# Patient Record
Sex: Female | Born: 2000 | Race: Black or African American | Hispanic: No | Marital: Single
Health system: Southern US, Community
[De-identification: ages and names within clinical notes are randomized; demographics above are authoritative.]

## PROBLEM LIST (undated history)

## (undated) DIAGNOSIS — E669 Obesity, unspecified: Secondary | ICD-10-CM

## (undated) HISTORY — PX: TOE SURGERY: SHX1073

---
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
## 2019-01-22 ENCOUNTER — Emergency Department (HOSPITAL_COMMUNITY): Payer: Medicaid Other

## 2019-01-22 ENCOUNTER — Emergency Department (HOSPITAL_COMMUNITY)
Admission: EM | Admit: 2019-01-22 | Discharge: 2019-01-22 | Disposition: A | Discharge status: Home / Self Care | Payer: Medicaid Other | Attending: Emergency Medicine | Admitting: Emergency Medicine

## 2019-01-22 ENCOUNTER — Other Ambulatory Visit: Payer: Self-pay

## 2019-01-22 ENCOUNTER — Encounter (HOSPITAL_COMMUNITY): Payer: Self-pay

## 2019-01-22 DIAGNOSIS — Z6841 Body Mass Index (BMI) 40.0 and over, adult: Secondary | ICD-10-CM | POA: Insufficient documentation

## 2019-01-22 DIAGNOSIS — B9689 Other specified bacterial agents as the cause of diseases classified elsewhere: Secondary | ICD-10-CM | POA: Insufficient documentation

## 2019-01-22 DIAGNOSIS — N939 Abnormal uterine and vaginal bleeding, unspecified: Secondary | ICD-10-CM | POA: Insufficient documentation

## 2019-01-22 DIAGNOSIS — N760 Acute vaginitis: Secondary | ICD-10-CM | POA: Insufficient documentation

## 2019-01-22 DIAGNOSIS — Z01411 Encounter for gynecological examination (general) (routine) with abnormal findings: Secondary | ICD-10-CM

## 2019-01-22 DIAGNOSIS — E669 Obesity, unspecified: Secondary | ICD-10-CM | POA: Insufficient documentation

## 2019-01-22 DIAGNOSIS — Z7722 Contact with and (suspected) exposure to environmental tobacco smoke (acute) (chronic): Secondary | ICD-10-CM | POA: Insufficient documentation

## 2019-01-22 LAB — URINALYSIS, ROUTINE W REFLEX MICROSCOPIC
Bilirubin Urine: NEGATIVE
Glucose, UA: NEGATIVE mg/dL
Ketones, ur: NEGATIVE mg/dL
Leukocytes,Ua: NEGATIVE
Nitrite: NEGATIVE
Protein, ur: 30 mg/dL — AB
RBC / HPF: >50 RBC/hpf — ABNORMAL HIGH (ref 0–5)
Specific Gravity, Urine: 1.021 (ref 1.005–1.030)
pH: 5 (ref 5.0–8.0)

## 2019-01-22 LAB — COMPREHENSIVE METABOLIC PANEL
ALT: 16 U/L (ref 0–44)
AST: 16 U/L (ref 15–41)
Albumin: 3.4 g/dL — ABNORMAL LOW (ref 3.5–5.0)
Alkaline Phosphatase: 85 U/L (ref 38–126)
Anion gap: 7 (ref 5–15)
BUN: 12 mg/dL (ref 6–20)
CO2: 25 mmol/L (ref 22–32)
Calcium: 9.1 mg/dL (ref 8.9–10.3)
Chloride: 105 mmol/L (ref 98–111)
Creatinine, Ser: 0.88 mg/dL (ref 0.44–1.00)
GFR calc Af Amer: >60 mL/min (ref >60)
GFR calc non Af Amer: >60 mL/min (ref >60)
Glucose, Bld: 97 mg/dL (ref 70–99)
Potassium: 4 mmol/L (ref 3.5–5.1)
Sodium: 137 mmol/L (ref 135–145)
Total Bilirubin: 0.4 mg/dL (ref 0.3–1.2)
Total Protein: 7.6 g/dL (ref 6.5–8.1)

## 2019-01-22 LAB — CBC
HCT: 35.8 % — ABNORMAL LOW (ref 36.0–46.0)
Hemoglobin: 10.4 g/dL — ABNORMAL LOW (ref 12.0–15.0)
MCH: 21.3 pg — ABNORMAL LOW (ref 26.0–34.0)
MCHC: 29.1 g/dL — ABNORMAL LOW (ref 30.0–36.0)
MCV: 73.2 fL — ABNORMAL LOW (ref 80.0–100.0)
Platelets: 368 10*3/uL (ref 150–400)
RBC: 4.89 MIL/uL (ref 3.87–5.11)
RDW: 19.7 % — ABNORMAL HIGH (ref 11.5–15.5)
WBC: 6.9 10*3/uL (ref 4.0–10.5)
nRBC: 0 % (ref 0.0–0.2)

## 2019-01-22 LAB — I-STAT BETA HCG BLOOD, ED (MC, WL, AP ONLY): I-stat hCG, quantitative: <5 m[IU]/mL (ref <5)

## 2019-01-22 LAB — WET PREP, GENITAL
Sperm: NONE SEEN
Trich, Wet Prep: NONE SEEN
Yeast Wet Prep HPF POC: NONE SEEN

## 2019-01-22 MED ORDER — NAPROXEN 500 MG PO TABS: 500.0000 mg | ORAL_TABLET | Freq: Three times a day (TID) | ORAL | 0 refills | Status: AC

## 2019-01-22 MED ORDER — METRONIDAZOLE 500 MG PO TABS: 500.0000 mg | ORAL_TABLET | Freq: Two times a day (BID) | ORAL | 0 refills | Status: AC

## 2019-01-22 MED ORDER — AZITHROMYCIN 250 MG PO TABS
1000.0000 mg | ORAL_TABLET | Freq: Once | ORAL | Status: AC
  Administered 2019-01-22: 1000 mg via ORAL
  Filled 2019-01-22: qty 4

## 2019-01-22 MED ORDER — CEFTRIAXONE SODIUM 250 MG IJ SOLR
250.0000 mg | Freq: Once | INTRAMUSCULAR | Status: AC
  Administered 2019-01-22: 18:00:00 250 mg via INTRAMUSCULAR
  Filled 2019-01-22: qty 250

## 2019-01-22 MED ORDER — DOXYCYCLINE HYCLATE 100 MG PO CAPS: 100.0000 mg | ORAL_CAPSULE | Freq: Two times a day (BID) | ORAL | 0 refills | Status: AC

## 2019-01-22 MED ORDER — STERILE WATER FOR INJECTION IJ SOLN
INTRAMUSCULAR | Status: AC
  Administered 2019-01-22: 0.9 mL
  Filled 2019-01-22: qty 10

## 2019-01-22 NOTE — ED Triage Notes (Signed)
Pt reports vaginal bleeding for the past month, intermittent cramps. States some days are heavier than others. Pt denies any other complaints.

## 2019-01-22 NOTE — ED Provider Notes (Signed)
[facility] EMERGENCY DEPARTMENT Provider Note   CSN: [id number] Arrival date & time: 01/22/19  1321     History   Chief Complaint Chief Complaint  Patient presents with   Vaginal Bleeding    HPI Kelsey Sawyer is a 18 y.o. female presents to the ER for evaluation of vaginal bleeding onset 29 days ago. Reports daily dark blood spotting, she wakes up in the morning and has to change her pad only once in the morning. Has developed intermittent cramping in the lower abdomen and low back that is usually worse at nighttime currently it is only mild. Prior to 1 month ago before this started she used to get regular monthly periods that were sometimes heavy. She has never been sexually active. No history of STDs, once had yeast infection. No IUDs. Does not use condoms. Does not take any medicines or birth control. No known pelvic issues like endometriosis, cysts or fibroids. No bleeding or clotting disorders. No interventions. Modifying factors. Denies associated fever, nausea, vomiting, dysuria, chest pain, shortness of breath or lightheadedness.   HPI  Past Medical History:  Diagnosis Date   Obesity     There are no active problems to display for this patient.   Past Surgical History:  Procedure Laterality Date   MASS EXCISION  08/29/2011   posterior scalp   SCALP LACERATION REPAIR N/A 06/17/2013   Procedure: EXCISION OF KELOID OF SCALP ;  Surgeon: Glenna Fellows, MD;  Location: [facility] OR;  Service: Plastics;  Laterality: N/A;   TOE SURGERY     stitches     OB History   No obstetric history on file.      Home Medications    Prior to Admission medications   Medication Sig Start Date End Date Taking? Authorizing Provider  cyclobenzaprine (FLEXERIL) 5 MG tablet Take 1 tablet (5 mg total) by mouth 3 (three) times daily as needed for muscle spasms. 06/01/15   Lowanda Foster, NP  doxycycline (VIBRAMYCIN) 100 MG capsule Take 1 capsule (100 mg total) by mouth  2 (two) times daily. 01/22/19   Liberty Handy, PA-C  HYDROcodone-acetaminophen (NORCO) 5-325 MG per tablet Take 1 tablet by mouth every 6 (six) hours as needed for moderate pain. 06/17/13   Glenna Fellows, MD  ibuprofen (ADVIL,MOTRIN) 600 MG tablet Take 1 tab PO Q6h x 2 days then Q6h prn pain 06/01/15   Lowanda Foster, NP  metroNIDAZOLE (FLAGYL) 500 MG tablet Take 1 tablet (500 mg total) by mouth 2 (two) times daily. 01/22/19   Liberty Handy, PA-C  naproxen (NAPROSYN) 500 MG tablet Take 1 tablet (500 mg total) by mouth 3 (three) times daily with meals for 7 days. 01/22/19 01/29/19  Liberty Handy, PA-C    Family History Family History  Problem Relation Age of Onset   Miscarriages / [place] Mother    Arthritis Father    Depression Father    Diabetes Father    Hypertension Father    Hyperlipidemia Father    Mental retardation Father    Learning disabilities Father    Asthma Brother    Drug abuse Maternal Aunt    Asthma Paternal Aunt    Kidney disease Paternal Aunt    Early death Paternal Uncle    Heart disease Paternal Uncle    Arthritis Maternal Grandfather    Vision loss Maternal Grandfather     Social History Social History   Tobacco Use   Smoking status: Passive Smoke Exposure - Never Smoker  Substance Use Topics   Alcohol use: Not on file   Drug use: Not on file     Allergies   Patient has no known allergies.   Review of Systems Review of Systems  Genitourinary: Positive for menstrual problem, pelvic pain and vaginal bleeding.  Musculoskeletal: Positive for back pain.  All other systems reviewed and are negative.    Physical Exam Updated Vital Signs BP 113/79 (BP Location: Right Wrist)    Pulse 80    Temp 98 F (36.7 C) (Oral)    Resp 15    Ht 5\' 4"  (1.626 m)    Wt (!) 181.4 kg    LMP 12/23/2018    SpO2 100%    BMI 68.66 kg/m   Physical Exam Vitals signs and nursing note reviewed.  Constitutional:      Appearance: She  is well-developed.     Comments: Very obese female.  HENT:     Head: Normocephalic and atraumatic.     Nose: Nose normal.  Eyes:     Conjunctiva/sclera: Conjunctivae normal.  Neck:     Musculoskeletal: Normal range of motion.  Cardiovascular:     Rate and Rhythm: Normal rate and regular rhythm.  Pulmonary:     Effort: Pulmonary effort is normal.     Breath sounds: Normal breath sounds.  Abdominal:     General: Bowel sounds are normal.     Palpations: Abdomen is soft.     Tenderness: There is no abdominal tenderness.     Comments: Obese abdomen, soft, nontender. No lower/suprapubic or CVA tenderness.  Negative murphy's and mcburney's  Genitourinary:    Comments:  Exam performed with EMT at bedside for assistance. External genitalia without lesions.  No groin lymphadenopathy.  Vaginal mucosa and cervix pink without lesions.  Scant dark blood in vaginal vault.  Cervical os visualized, closed without polyps, friability, lesions.  Patient has significant pain with entire pelvic exam including external genitalia examination, insertion of speculum and bimanual exam, frequently jumping up and complaining of pain.  Asked her if it was pinching of the skin or hair or deeper examination and states "everything" hurts even palpation of external genitalia/intraoitus.  +CMT.  Nonpalpable, but diffuse tenderness in both adnexa.  Perianal skin normal without lesions. Musculoskeletal: Normal range of motion.     Comments: No reproducible TL spine midline or paraspinal muscle tenderness. Able to sit up and reposition herself in bed without difficulty or assistance.  Skin:    General: Skin is warm and dry.     Capillary Refill: Capillary refill takes less than 2 seconds.  Neurological:     Mental Status: She is alert.  Psychiatric:        Behavior: Behavior normal.      ED Treatments / Results  Labs (all labs ordered are listed, but only abnormal results are displayed) Labs Reviewed  WET PREP,  GENITAL - Abnormal; Notable for the following components:      Result Value   Clue Cells Wet Prep HPF POC PRESENT (*)    WBC, Wet Prep HPF POC FEW (*)    All other components within normal limits  CBC - Abnormal; Notable for the following components:   Hemoglobin 10.4 (*)    HCT 35.8 (*)    MCV 73.2 (*)    MCH 21.3 (*)    MCHC 29.1 (*)    RDW 19.7 (*)    All other components within normal limits  COMPREHENSIVE METABOLIC PANEL - Abnormal; Notable for  the following components:   Albumin 3.4 (*)    All other components within normal limits  URINALYSIS, ROUTINE W REFLEX MICROSCOPIC - Abnormal; Notable for the following components:   Hgb urine dipstick LARGE (*)    Protein, ur 30 (*)    RBC / HPF >50 (*)    Bacteria, UA RARE (*)    All other components within normal limits  URINE CULTURE  I-STAT BETA HCG BLOOD, ED ([facility], [facility], [facility] ONLY)  GC/CHLAMYDIA PROBE AMP ([place]) NOT AT [facility]    EKG None  Radiology US Transvaginal Non-ob  Result Date: 01/22/2019 CLINICAL DATA:  Vaginal bleeding and pelvic pain. EXAM: TRANSABDOMINAL AND TRANSVAGINAL ULTRASOUND OF PELVIS DOPPLER ULTRASOUND OF OVARIES TECHNIQUE: Both transabdominal and transvaginal ultrasound examinations of the pelvis were performed. Transabdominal technique was performed for global imaging of the pelvis including uterus, ovaries, adnexal regions, and pelvic cul-de-sac. It was necessary to proceed with endovaginal exam following the transabdominal exam to visualize the ovaries. Color and duplex Doppler ultrasound was utilized to evaluate blood flow to the ovaries. COMPARISON:  None. FINDINGS: Uterus Measurements: 7.1 x 3.8 x 3.6 cm. = volume: 50 mL. The fundus was difficult to visualize. No definite uterine mass. Endometrium Thickness: 8 mm.  No focal abnormality visualized. Right ovary Measurements: 2.6 x 3 x 1.96 cm = volume: 7.8 mL. Normal appearance/no adnexal mass. Left ovary Not visualized on this exam. Pulsed Doppler evaluation  of both ovaries demonstrates a normal arterial and venous waveform involving the right ovary. The left ovary was not visualized. Other findings No abnormal free fluid. IMPRESSION: 1. Limited study as detailed above. 2. The left ovary was not visualized. 3. Unremarkable appearance of the right ovary. 4. No acute sonographic abnormality. Electronically Signed   By: Katherine Mantle M.D.   On: 01/22/2019 21:30   US Pelvis Complete  Result Date: 01/22/2019 CLINICAL DATA:  Vaginal bleeding and pelvic pain. EXAM: TRANSABDOMINAL AND TRANSVAGINAL ULTRASOUND OF PELVIS DOPPLER ULTRASOUND OF OVARIES TECHNIQUE: Both transabdominal and transvaginal ultrasound examinations of the pelvis were performed. Transabdominal technique was performed for global imaging of the pelvis including uterus, ovaries, adnexal regions, and pelvic cul-de-sac. It was necessary to proceed with endovaginal exam following the transabdominal exam to visualize the ovaries. Color and duplex Doppler ultrasound was utilized to evaluate blood flow to the ovaries. COMPARISON:  None. FINDINGS: Uterus Measurements: 7.1 x 3.8 x 3.6 cm. = volume: 50 mL. The fundus was difficult to visualize. No definite uterine mass. Endometrium Thickness: 8 mm.  No focal abnormality visualized. Right ovary Measurements: 2.6 x 3 x 1.96 cm = volume: 7.8 mL. Normal appearance/no adnexal mass. Left ovary Not visualized on this exam. Pulsed Doppler evaluation of both ovaries demonstrates a normal arterial and venous waveform involving the right ovary. The left ovary was not visualized. Other findings No abnormal free fluid. IMPRESSION: 1. Limited study as detailed above. 2. The left ovary was not visualized. 3. Unremarkable appearance of the right ovary. 4. No acute sonographic abnormality. Electronically Signed   By: Katherine Mantle M.D.   On: 01/22/2019 21:30   [place] Art/ven Flow Abd Pelv Doppler  Result Date: 01/22/2019 CLINICAL DATA:  Vaginal bleeding and pelvic pain.  EXAM: TRANSABDOMINAL AND TRANSVAGINAL ULTRASOUND OF PELVIS DOPPLER ULTRASOUND OF OVARIES TECHNIQUE: Both transabdominal and transvaginal ultrasound examinations of the pelvis were performed. Transabdominal technique was performed for global imaging of the pelvis including uterus, ovaries, adnexal regions, and pelvic cul-de-sac. It was necessary to proceed with endovaginal exam following the  transabdominal exam to visualize the ovaries. Color and duplex Doppler ultrasound was utilized to evaluate blood flow to the ovaries. COMPARISON:  None. FINDINGS: Uterus Measurements: 7.1 x 3.8 x 3.6 cm. = volume: 50 mL. The fundus was difficult to visualize. No definite uterine mass. Endometrium Thickness: 8 mm.  No focal abnormality visualized. Right ovary Measurements: 2.6 x 3 x 1.96 cm = volume: 7.8 mL. Normal appearance/no adnexal mass. Left ovary Not visualized on this exam. Pulsed Doppler evaluation of both ovaries demonstrates a normal arterial and venous waveform involving the right ovary. The left ovary was not visualized. Other findings No abnormal free fluid. IMPRESSION: 1. Limited study as detailed above. 2. The left ovary was not visualized. 3. Unremarkable appearance of the right ovary. 4. No acute sonographic abnormality. Electronically Signed   By: Katherine Mantlehristopher  Green M.D.   On: 01/22/2019 21:30    Procedures Procedures (including critical care time)  Medications Ordered in ED Medications  cefTRIAXone (ROCEPHIN) injection 250 mg (250 mg Intramuscular Given 01/22/19 1748)  azithromycin (ZITHROMAX) tablet 1,000 mg (1,000 mg Oral Given 01/22/19 1748)  sterile water (preservative free) injection (0.9 mLs  Given 01/22/19 1756)     Initial Impression / Assessment and Plan / ED Course  I have reviewed the triage vital signs and the nursing notes.  Pertinent labs & imaging results that were available during my care of the patient were reviewed by me and considered in my medical decision making (see chart  for details).  Clinical Course as of Jan 22 2143  Tue Jan 22, 2019  1727 Clue Cells Wet Prep HPF POC(!): PRESENT [CG]  1727 WBC, Wet Prep HPF POC(!): FEW [CG]  1727 Hemoglobin(!): 10.4 [CG]  1929 In setting of vaginal bleeding/spotting   RBC / HPF(!): >50 [CG]    Clinical Course User Index [CG] Liberty HandyGibbons, Kesia Dalto J, PA-C   18 year old female here with vaginal spotting for 1 month.  Per her description it is light.  Has some low abdominal and low back cramping.  Never been sexually active.  History of yeast infectionbut no other pelvic infections like STDs, BV.  No recent illnesses.  ER work-up initiated in triage shows hemoglobin 10.4.  Normal LFTs.  Hemodynamically stable.  She has no symptoms of anemia.  UA without signs of infection.  Wet prep shows BV and few WBC.  Pelvic exam shows diffuse tenderness in adnexa, and CMT with some discharge.  Given diffuse tenderness and bleeding will obtain pelvic US that could show signs suggestive of PID, uterine fibroids, cysts, etc.    2145: US unremarkable.   Although patient has never been sexually active and never had an STD, her diffuse tenderness on pelvic exam is concerning for PID.  This could be from current BV or other enteric infection.  Given risk of untreated PID, discussed with patient we will treat her for PID.  She understands BV and other enteric infection are rare but possible causes of PID but possible.  Will empirically also give her Rocephin and azithromycin here.  Will discharge with doxycycline, naproxen.  Considered PID complication like TOA but this is unlikely given chronicity of pain and symptoms, normal white count, afebrile.  No adnexal fullness on exam.  Doubt appendicitis given initial presentation of vaginal bleeding, no RLQ tenderness. I do not think further emergent imaging is indicated.  Recommended follow-up with OB/GYN for further discussion of her irregular spotting and lower abdominal pain.  She is not having heavy  bleeding, Hd instability, Strict return  precautions given.  Patient is comfortable with this.  Final Clinical Impressions(s) / ED Diagnoses   Final diagnoses:  Vaginal bleeding  Abnormal pelvic exam  Bacterial vaginosis    ED Discharge Orders         Ordered    doxycycline (VIBRAMYCIN) 100 MG capsule  2 times daily     01/22/19 1927    metroNIDAZOLE (FLAGYL) 500 MG tablet  2 times daily     01/22/19 1927    naproxen (NAPROSYN) 500 MG tablet  3 times daily with meals     01/22/19 1927           Jerrell Mylar 01/22/19 2144    Maia Plan, MD 01/22/19 2230

## 2019-01-22 NOTE — Discharge Instructions (Addendum)
You were seen in the ER for irregular vaginal bleeding and low abdominal and back pain.     We discussed several possibilities that could be causing your bleeding and pain symptoms.  You could have a hormonal disbalance causing to have irregular bleeding, you need to discuss your symptoms with OB/GYN for further evaluation and management.    Given the amount of pain you had during exam, we obtained an ultrasound which was normal.  Swabs obtained today showed bacterial vaginosis.  This is treated with antibiotics called Flagyl, take this as prescribed.  Do not drink alcohol with this medicine or it will cause severe nausea, vomiting or abdominal pain.    Some pelvic infections like gonorrhea, chlamydia, bacterial vaginosis, others can cause pelvic inflammatory disease.  Since you had a lot of discomfort on exam we will treat you for pelvic inflammatory disease.  Take doxycycline as prescribed.  Use naproxen 500 mg every 8 hours for pain.  You can add Tylenol if you need more pain control.    Return to the ER for fever, worsening abdominal pain, abnormal vaginal discharge or heavier vaginal bleeding, chest pain or shortness of breath or lightheadedness.

## 2019-01-22 NOTE — ED Notes (Signed)
Pt voided, but stated that she was unable to collect the specimen. Told to let me know when she has to go again so that we can make sure to collect.

## 2019-01-24 LAB — URINE CULTURE

## 2019-01-25 LAB — GC/CHLAMYDIA PROBE AMP (~~LOC~~) NOT AT ARMC
Chlamydia: NEGATIVE
Neisseria Gonorrhea: NEGATIVE

## 2020-03-30 IMAGING — US US PELVIS COMPLETE
1 series · 13 of 25 positions shown · non-contrast
Comparison: None.

CLINICAL DATA: Vaginal bleeding and pelvic pain.

EXAM:
TRANSABDOMINAL AND TRANSVAGINAL ULTRASOUND OF PELVIS
DOPPLER ULTRASOUND OF OVARIES
TECHNIQUE: Both transabdominal and transvaginal ultrasound examinations of the
pelvis were performed. Transabdominal technique was performed for
global imaging of the pelvis including uterus, ovaries, adnexal
regions, and pelvic cul-de-sac.
It was necessary to proceed with endovaginal exam following the
transabdominal exam to visualize the ovaries. Color and duplex
Doppler ultrasound was utilized to evaluate blood flow to the
ovaries.

[Series 1: us pelvis complete · 34 acquisitions, 13 frames shown]
[im 1/34]
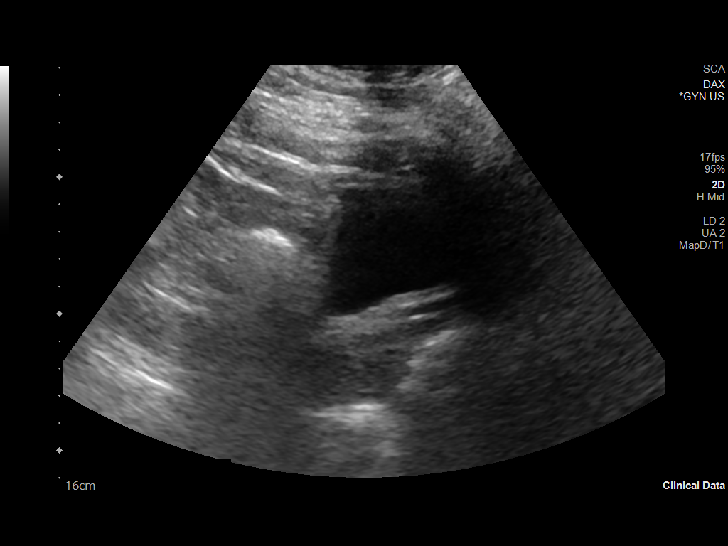
[im 3/34]
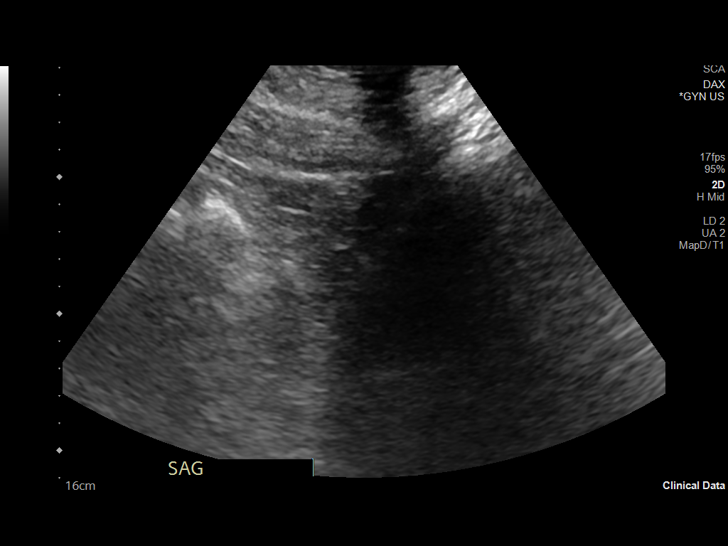
[im 6/34]
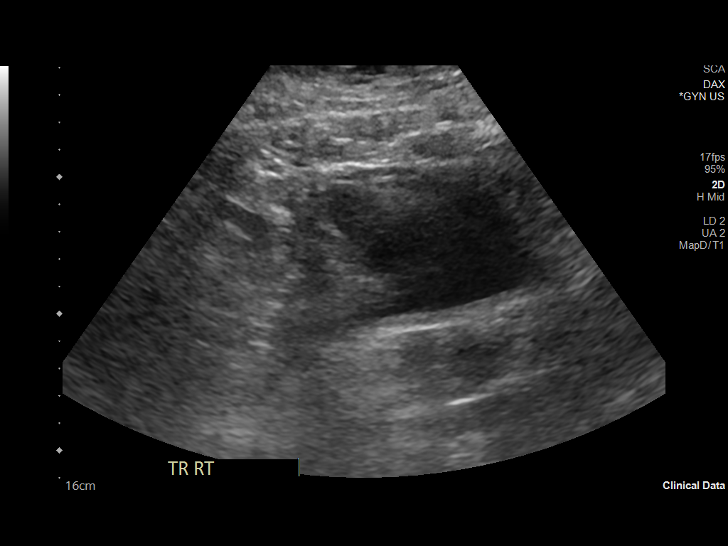
[im 9/34]
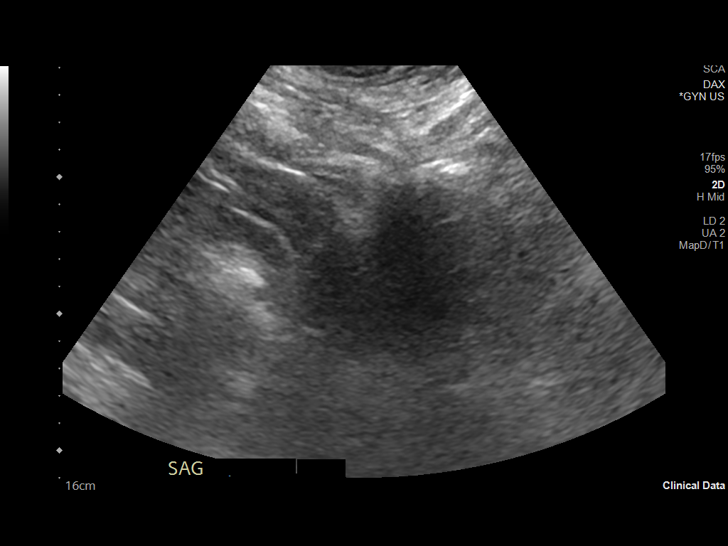
[im 12/34]
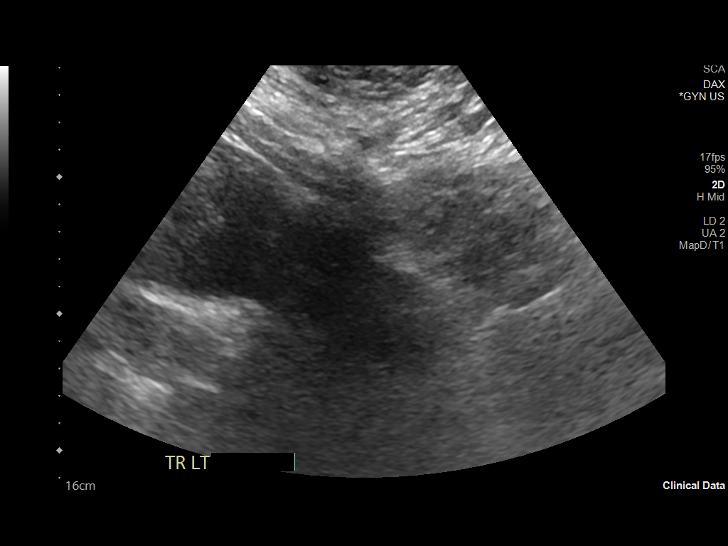
[im 14/34]
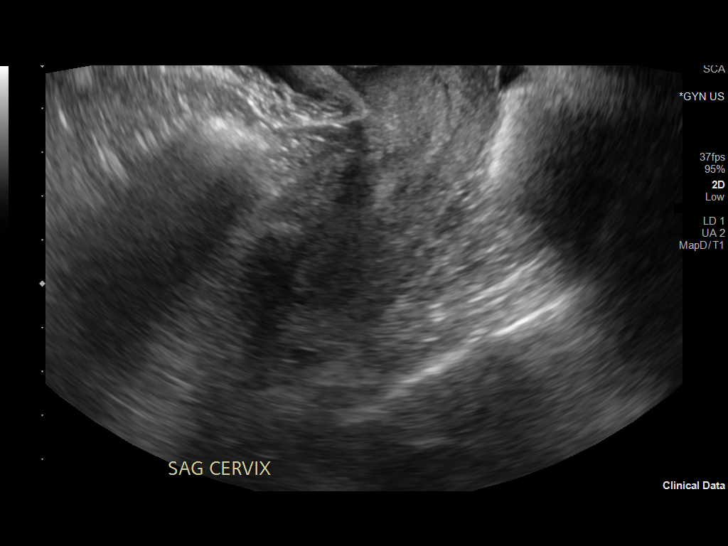
[im 17/34]
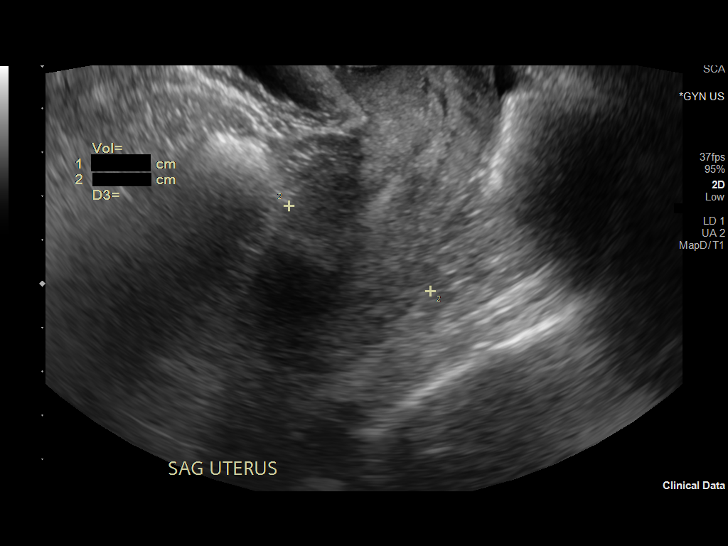
[im 20/34]
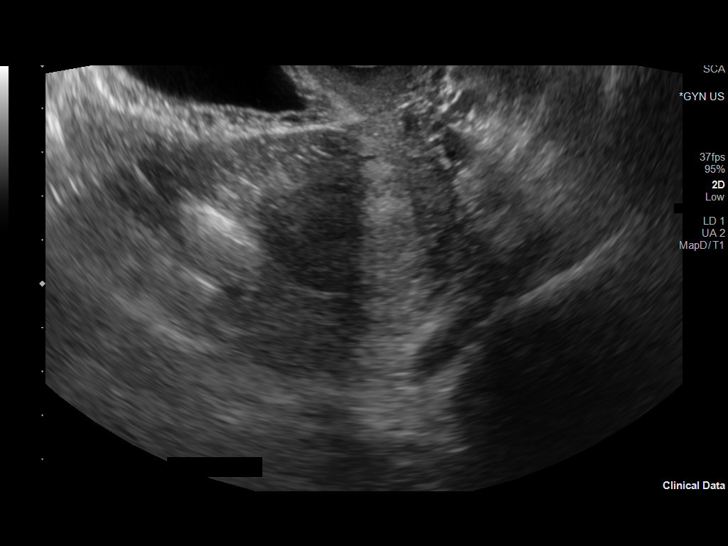
[im 23/34]
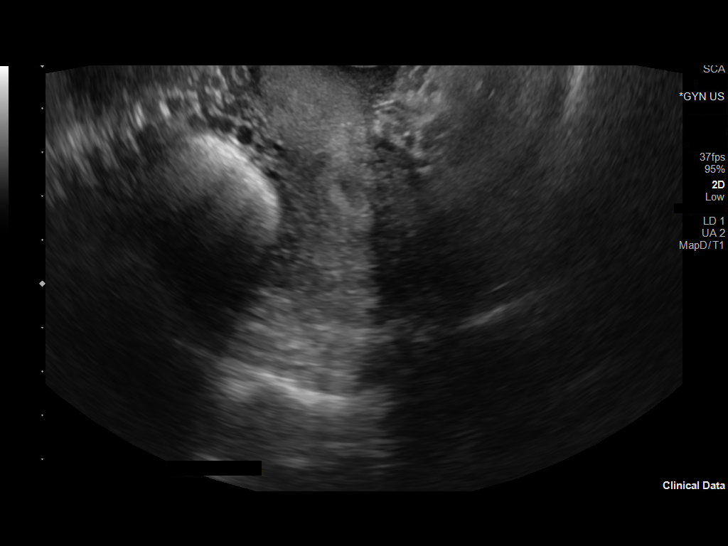
[im 25/34]
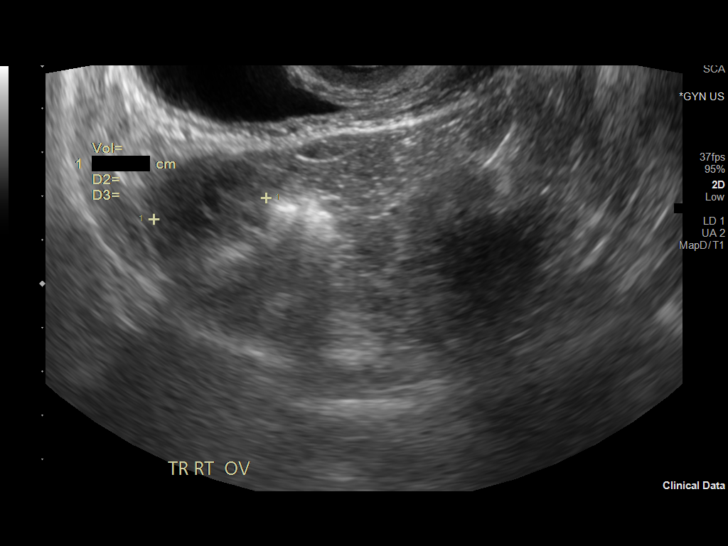
[im 28/34]
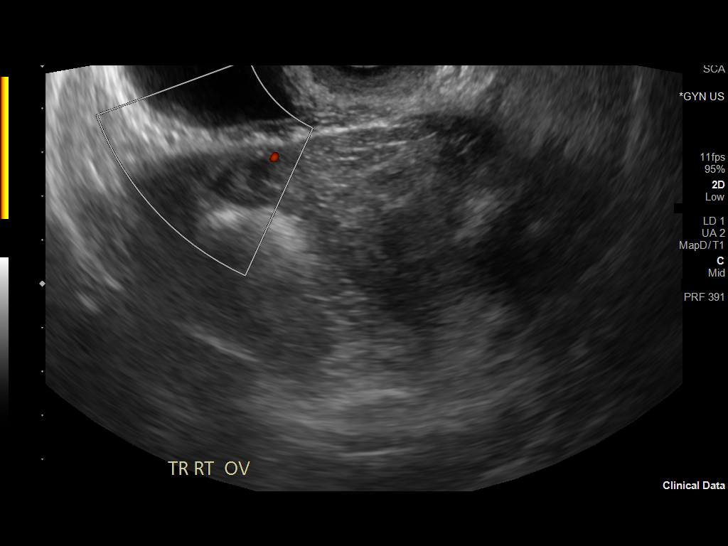
[im 31/34]
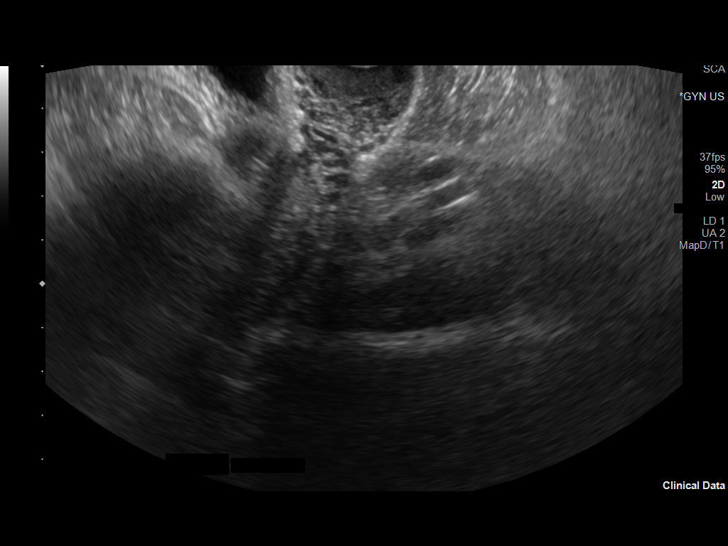
[im 34/34]
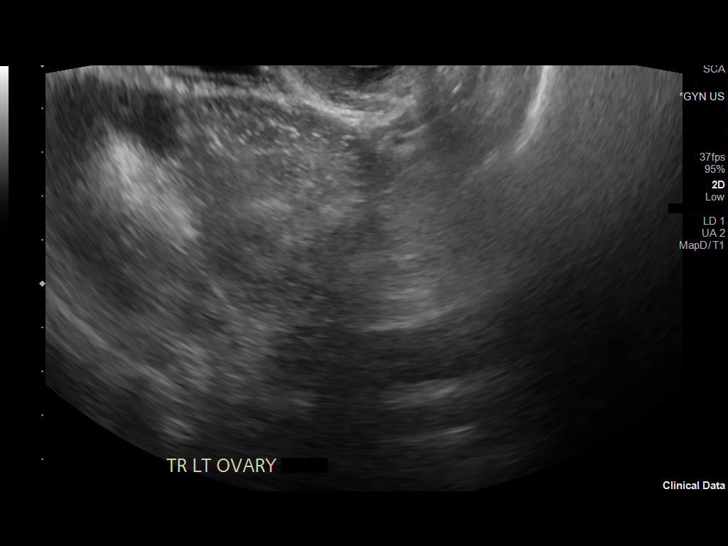

[13 of 25 positions shown; findings below may reference images not displayed]

FINDINGS: Uterus

Measurements: 7.1 x 3.8 x 3.6 cm. = volume: 50 mL. The fundus was
difficult to visualize. No definite uterine mass.

Endometrium

Thickness: 8 mm.  No focal abnormality visualized.

Right ovary

Measurements: 2.6 x 3 x 1.96 cm = volume: 7.8 mL. Normal
appearance/no adnexal mass.

Left ovary

Not visualized on this exam.

Pulsed Doppler evaluation of both ovaries demonstrates a normal
arterial and venous waveform involving the right ovary. The left
ovary was not visualized.

Other findings

No abnormal free fluid.
IMPRESSION: 1. Limited study as detailed above.
2. The left ovary was not visualized.
3. Unremarkable appearance of the right ovary.
4. No acute sonographic abnormality.

## 2020-03-30 IMAGING — US US ART/VEN ABD/PELV/SCROTUM DOPPLER LTD
1 series · 13 of 25 positions shown · non-contrast
Comparison: None.

CLINICAL DATA: Vaginal bleeding and pelvic pain.

EXAM:
TRANSABDOMINAL AND TRANSVAGINAL ULTRASOUND OF PELVIS
DOPPLER ULTRASOUND OF OVARIES
TECHNIQUE: Both transabdominal and transvaginal ultrasound examinations of the
pelvis were performed. Transabdominal technique was performed for
global imaging of the pelvis including uterus, ovaries, adnexal
regions, and pelvic cul-de-sac.
It was necessary to proceed with endovaginal exam following the
transabdominal exam to visualize the ovaries. Color and duplex
Doppler ultrasound was utilized to evaluate blood flow to the
ovaries.

[Series 1: us art/ven abd/pelv/scrotum doppler ltd · 34 acquisitions, 13 frames shown]
[im 1/34]
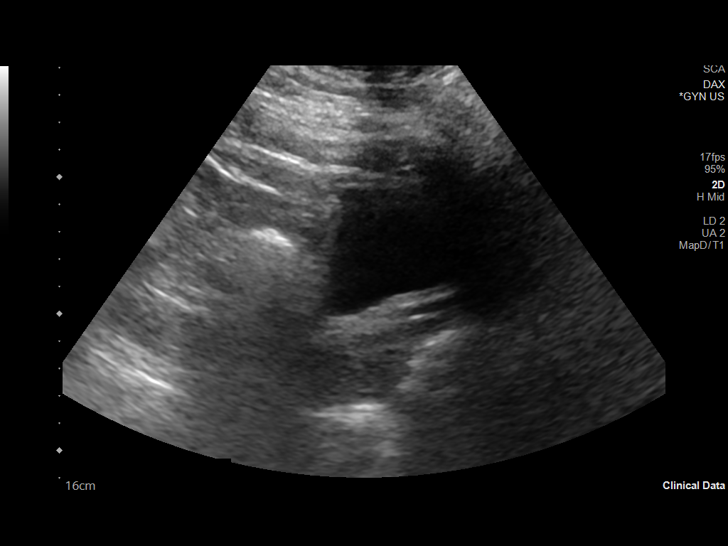
[im 3/34]
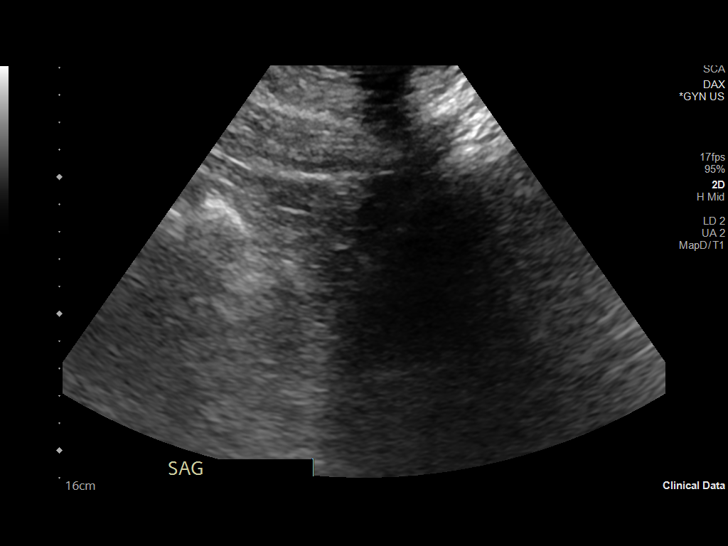
[im 6/34]
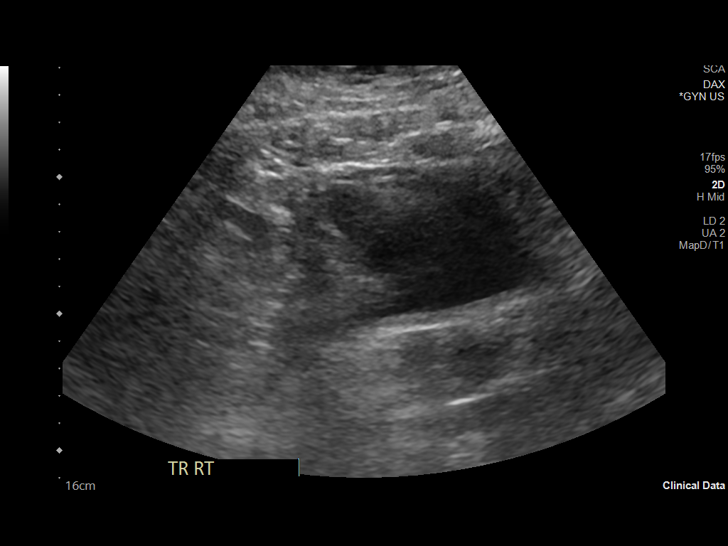
[im 9/34]
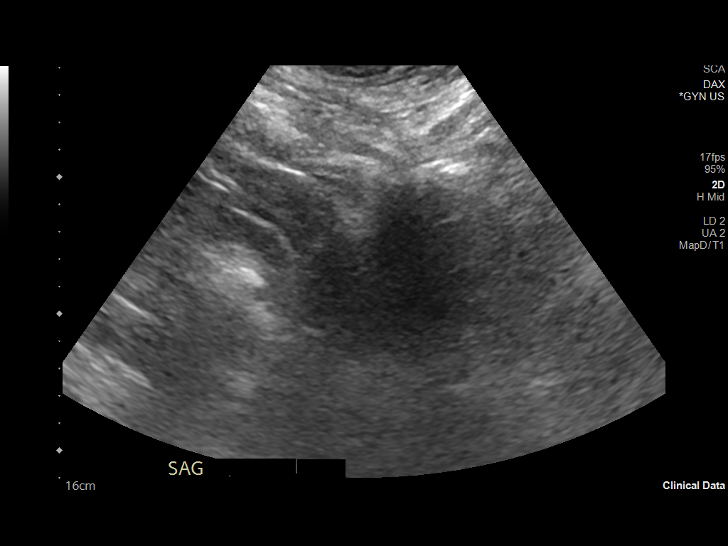
[im 12/34]
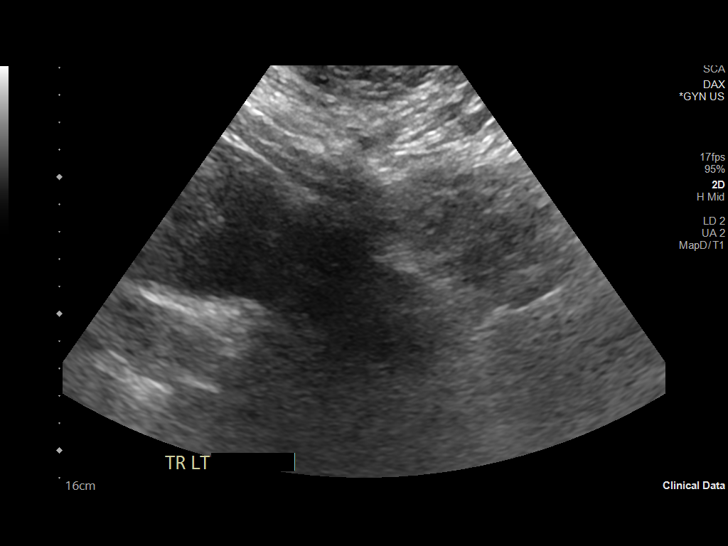
[im 14/34]
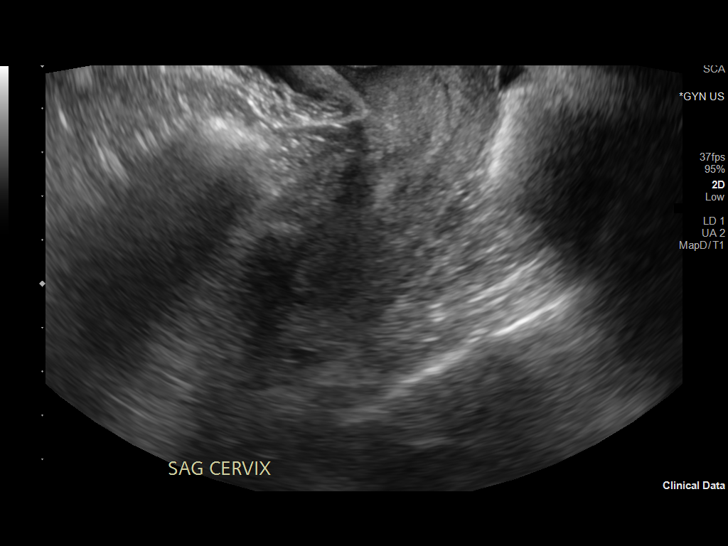
[im 17/34]
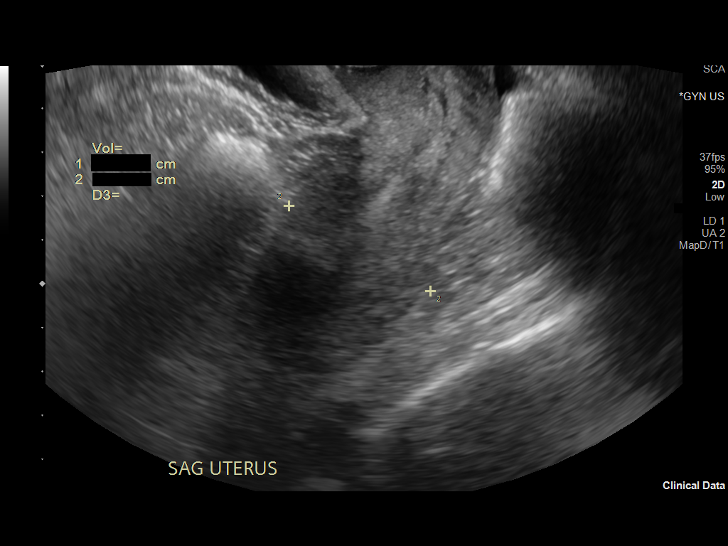
[im 20/34]
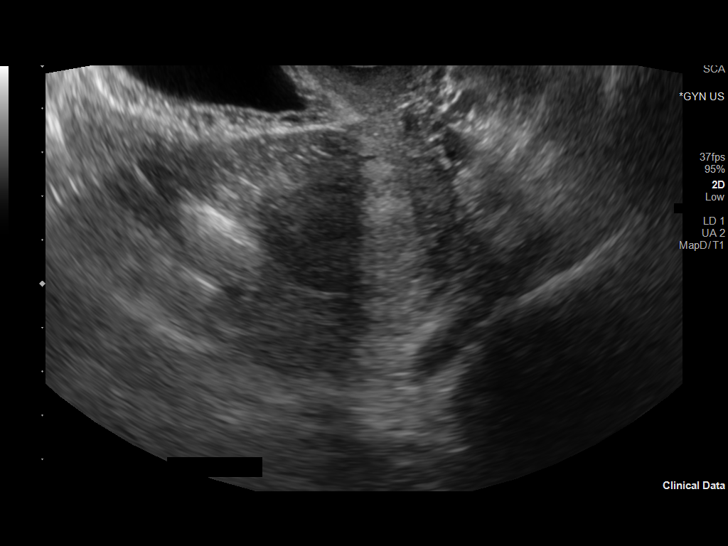
[im 23/34]
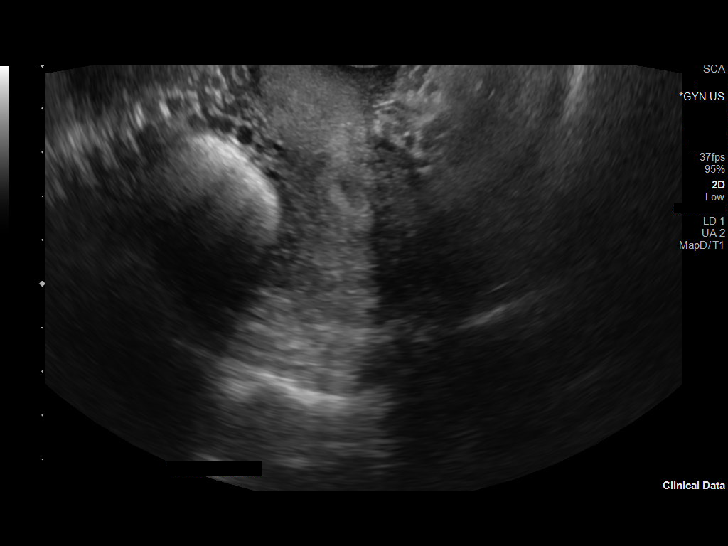
[im 25/34]
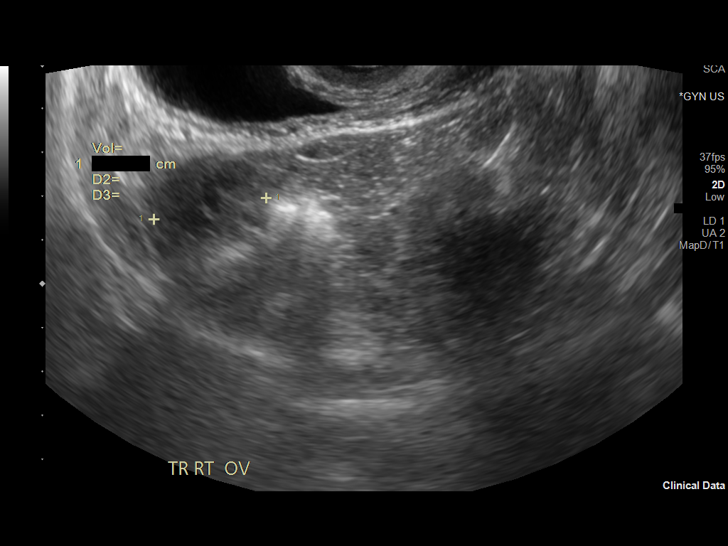
[im 28/34]
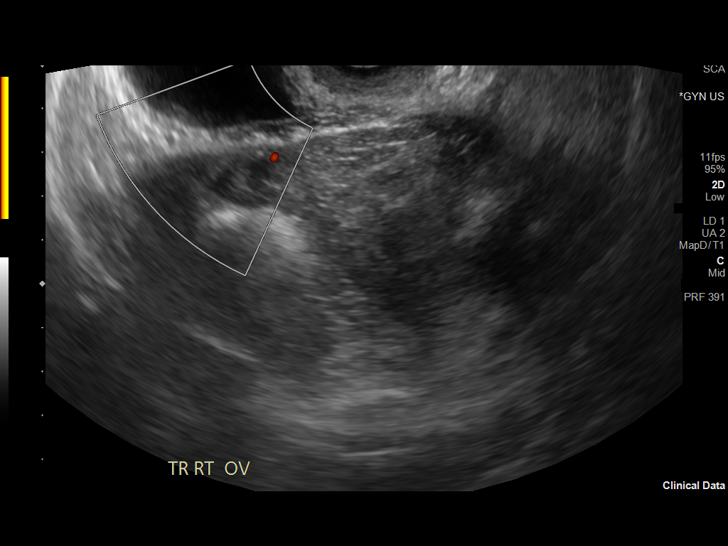
[im 31/34]
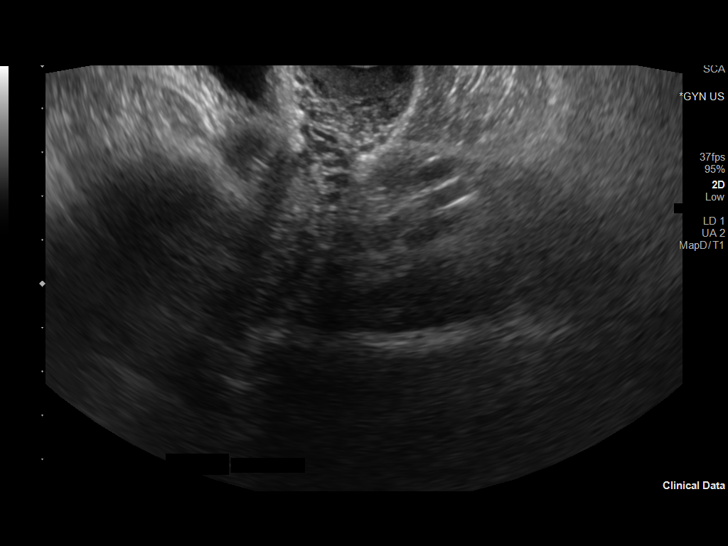
[im 34/34]
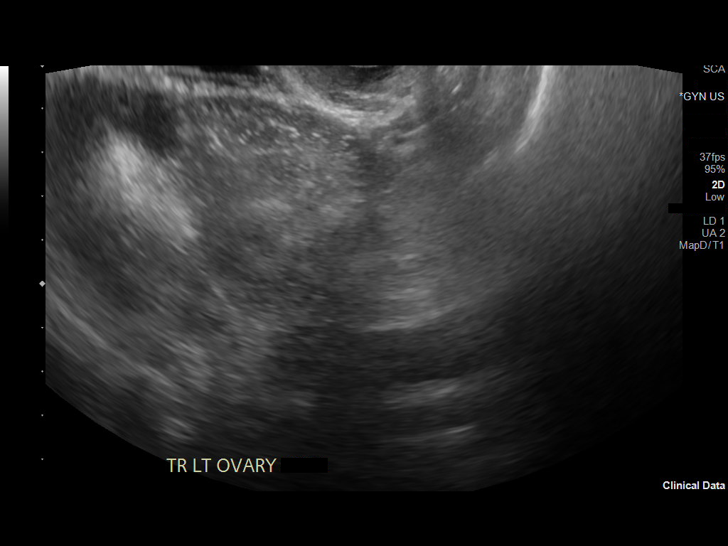

[13 of 25 positions shown; findings below may reference images not displayed]

FINDINGS: Uterus

Measurements: 7.1 x 3.8 x 3.6 cm. = volume: 50 mL. The fundus was
difficult to visualize. No definite uterine mass.

Endometrium

Thickness: 8 mm.  No focal abnormality visualized.

Right ovary

Measurements: 2.6 x 3 x 1.96 cm = volume: 7.8 mL. Normal
appearance/no adnexal mass.

Left ovary

Not visualized on this exam.

Pulsed Doppler evaluation of both ovaries demonstrates a normal
arterial and venous waveform involving the right ovary. The left
ovary was not visualized.

Other findings

No abnormal free fluid.
IMPRESSION: 1. Limited study as detailed above.
2. The left ovary was not visualized.
3. Unremarkable appearance of the right ovary.
4. No acute sonographic abnormality.
